# Patient Record
Sex: Female | Born: 1960 | Race: Black or African American | Hispanic: No | Marital: Single | Smoking: Never smoker
Health system: Southern US, Community
[De-identification: ages and names within clinical notes are randomized; demographics above are authoritative.]

## PROBLEM LIST (undated history)

## (undated) DIAGNOSIS — S069X9A Unspecified intracranial injury with loss of consciousness of unspecified duration, initial encounter: Secondary | ICD-10-CM

## (undated) DIAGNOSIS — R0789 Other chest pain: Secondary | ICD-10-CM

## (undated) DIAGNOSIS — S069XAA Unspecified intracranial injury with loss of consciousness status unknown, initial encounter: Secondary | ICD-10-CM

---

## 2014-10-16 ENCOUNTER — Encounter (HOSPITAL_COMMUNITY): Payer: Self-pay | Admitting: Emergency Medicine

## 2014-10-16 ENCOUNTER — Emergency Department (HOSPITAL_COMMUNITY): Payer: Self-pay

## 2014-10-16 ENCOUNTER — Observation Stay (HOSPITAL_COMMUNITY)
Admission: EM | Admit: 2014-10-16 | Discharge: 2014-10-18 | Disposition: A | Discharge status: Home / Self Care | Payer: Self-pay | Attending: Interventional Cardiology | Admitting: Interventional Cardiology

## 2014-10-16 ENCOUNTER — Encounter (HOSPITAL_COMMUNITY)
Admission: EM | Disposition: A | Discharge status: Home / Self Care | Payer: Self-pay | Source: Home / Self Care | Attending: Emergency Medicine

## 2014-10-16 DIAGNOSIS — R079 Chest pain, unspecified: Secondary | ICD-10-CM | POA: Diagnosis present

## 2014-10-16 DIAGNOSIS — I422 Other hypertrophic cardiomyopathy: Secondary | ICD-10-CM

## 2014-10-16 DIAGNOSIS — R0789 Other chest pain: Principal | ICD-10-CM

## 2014-10-16 DIAGNOSIS — R9431 Abnormal electrocardiogram [ECG] [EKG]: Secondary | ICD-10-CM | POA: Diagnosis present

## 2014-10-16 DIAGNOSIS — Z87820 Personal history of traumatic brain injury: Secondary | ICD-10-CM | POA: Insufficient documentation

## 2014-10-16 DIAGNOSIS — R0600 Dyspnea, unspecified: Secondary | ICD-10-CM | POA: Insufficient documentation

## 2014-10-16 DIAGNOSIS — R0602 Shortness of breath: Secondary | ICD-10-CM

## 2014-10-16 HISTORY — DX: Unspecified intracranial injury with loss of consciousness of unspecified duration, initial encounter: S06.9X9A

## 2014-10-16 HISTORY — DX: Other chest pain: R07.89

## 2014-10-16 HISTORY — DX: Unspecified intracranial injury with loss of consciousness status unknown, initial encounter: S06.9XAA

## 2014-10-16 LAB — BASIC METABOLIC PANEL
Anion gap: 15 (ref 5–15)
BUN: 13 mg/dL (ref 6–23)
CO2: 22 meq/L (ref 19–32)
CREATININE: 0.81 mg/dL (ref 0.50–1.10)
Calcium: 9.7 mg/dL (ref 8.4–10.5)
Chloride: 104 mEq/L (ref 96–112)
GFR calc non Af Amer: 80 mL/min — ABNORMAL LOW (ref >90)
Glucose, Bld: 93 mg/dL (ref 70–99)
POTASSIUM: 4.2 meq/L (ref 3.7–5.3)
Sodium: 141 mEq/L (ref 137–147)

## 2014-10-16 LAB — CBC
HEMATOCRIT: 44 % (ref 36.0–46.0)
HEMOGLOBIN: 14.7 g/dL (ref 12.0–15.0)
MCH: 30.4 pg (ref 26.0–34.0)
MCHC: 33.4 g/dL (ref 30.0–36.0)
MCV: 91.1 fL (ref 78.0–100.0)
Platelets: 185 10*3/uL (ref 150–400)
RBC: 4.83 MIL/uL (ref 3.87–5.11)
RDW: 14.8 % (ref 11.5–15.5)
WBC: 7 10*3/uL (ref 4.0–10.5)

## 2014-10-16 LAB — PROTIME-INR
INR: 1.03 (ref 0.00–1.49)
PROTHROMBIN TIME: 13.6 s (ref 11.6–15.2)

## 2014-10-16 LAB — PRO B NATRIURETIC PEPTIDE
Pro B Natriuretic peptide (BNP): 393.7 pg/mL — ABNORMAL HIGH (ref 0–125)
Pro B Natriuretic peptide (BNP): 321.4 pg/mL — ABNORMAL HIGH (ref 0–125)

## 2014-10-16 LAB — TROPONIN I: Troponin I: <0.3 ng/mL (ref <0.30)

## 2014-10-16 LAB — I-STAT TROPONIN, ED: Troponin i, poc: 0.04 ng/mL (ref 0.00–0.08)

## 2014-10-16 LAB — TSH: TSH: 1.78 u[IU]/mL (ref 0.350–4.500)

## 2014-10-16 LAB — D-DIMER, QUANTITATIVE: D-Dimer, Quant: 0.55 ug/mL-FEU — ABNORMAL HIGH (ref 0.00–0.48)

## 2014-10-16 SURGERY — LEFT HEART CATHETERIZATION WITH CORONARY ANGIOGRAM

## 2014-10-16 MED ORDER — ONDANSETRON HCL 4 MG/2ML IJ SOLN: 4.0000 mg | Freq: Four times a day (QID) | INTRAMUSCULAR | Status: DC | PRN

## 2014-10-16 MED ORDER — ACETAMINOPHEN 325 MG PO TABS: 650.0000 mg | ORAL_TABLET | ORAL | Status: DC | PRN

## 2014-10-16 MED ORDER — ENOXAPARIN SODIUM 40 MG/0.4ML ~~LOC~~ SOLN
40.0000 mg | Freq: Every day | SUBCUTANEOUS | Status: DC
  Administered 2014-10-16 – 2014-10-17 (×2): 40 mg via SUBCUTANEOUS
  Filled 2014-10-16 (×3): qty 0.4

## 2014-10-16 NOTE — ED Notes (Signed)
Dr. Katrinka BlazingSmith canceled code STEMI

## 2014-10-16 NOTE — Plan of Care (Signed)
Problem: Phase I Progression Outcomes Goal: Hemodynamically stable Outcome: Completed/Met Date Met:  10/16/14     

## 2014-10-16 NOTE — Plan of Care (Deleted)
Problem: Phase I Progression Outcomes Goal: Anginal pain relieved Outcome: Completed/Met Date Met:  10/16/14     

## 2014-10-16 NOTE — ED Notes (Signed)
Dr. Smith at bedside.

## 2014-10-16 NOTE — Plan of Care (Deleted)
Problem: Phase I Progression Outcomes Goal: Hemodynamically stable Outcome: Progressing     

## 2014-10-16 NOTE — Progress Notes (Addendum)
Chaplain responded to code stemi. Pt already in room. Chaplain met with pt friends who she is staying with for vacation. Pt lives in [place], [place] with her sister and caregiver who is currently on vacation in [place]. Pt has another sister in [place]. 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 Chaplain was introduced by pt friends in [place]. Chaplain provided ministry of presence, offered to keep pt in prayer, offered hospitality, and facilitated communication between pt friends and staff. Page chaplain as needed. Code stemi cancelled 5:55pm.   10/16/14 1800  Clinical Encounter Type  Visited With Patient and family together;Health care provider  Visit Type Initial;Code;Spiritual support  Referral From Nurse  Spiritual Encounters  Spiritual Needs Emotional  Stress Factors  Family Stress Factors Health changes;Lack of knowledge  Marcelino Scot 10/16/2014 6:27 PM

## 2014-10-16 NOTE — ED Notes (Signed)
Per ems pt was watching movie with caretaker on tablet when pt started complaining of chest pain, broke out in a sweat. Pt has huge family history of heart attacks, without diagnosis of heart disease. Pt 93% on RA, ems placed on 4 L sats to 100%. Received 1 nitro and 324 of aspirin in route

## 2014-10-16 NOTE — Plan of Care (Signed)
Problem: Phase I Progression Outcomes Goal: Aspirin unless contraindicated Outcome: Completed/Met Date Met:  10/16/14

## 2014-10-16 NOTE — ED Provider Notes (Signed)
CSN: [id number]     Arrival date & time 10/16/14  1721 History   First MD Initiated Contact with Patient 10/16/14 1746     Chief Complaint  Patient presents with  . Chest Pain     HPI  Patient presents as a code STEMI Initially the patient is here alone and information is obtained via translator phone. Eventually, the patient's sister arrived, and assisted with translation. The patient is [place], has a history of prior TBI. Patient's pain began less than 1 hour prior to my evaluation. Patient was at rest, when she developed headache, dyspnea, diaphoresis.  Subsequently she seems to develop pain in the thoracic area, though the location is unclear.  By the time of our evaluation patient has received nitroglycerin, aspirin via EMS, and pain resolved. Currently the patient also complains of mild headache.   Past Medical History  Diagnosis Date  . TBI (traumatic brain injury)    History reviewed. No pertinent past surgical history. History reviewed. No pertinent family history. History  Substance Use Topics  . Smoking status: Not on file  . Smokeless tobacco: Not on file  . Alcohol Use: Not on file   OB History    No data available     Review of Systems  Unable to perform ROS: Other      Allergies  Review of patient's allergies indicates not on file.  Home Medications   Prior to Admission medications   Not on File   BP 144/86 mmHg  Pulse 73  Resp 19  SpO2 100% Physical Exam  Constitutional: She is oriented to person, place, and time. She appears well-developed and well-nourished. No distress.  HENT:  Head: Normocephalic and atraumatic.  Eyes: Conjunctivae and EOM are normal.  Cardiovascular: Normal rate and regular rhythm.   Pulmonary/Chest: Effort normal and breath sounds normal. No stridor. No respiratory distress.  Abdominal: She exhibits no distension.  Musculoskeletal: She exhibits no edema.  Neurological: She is alert and oriented to person, place,  and time. No cranial nerve deficit.  Skin: Skin is warm and dry.  Psychiatric: She has a normal mood and affect.  Nursing note and vitals reviewed.   ED Course  Procedures (including critical care time) Labs Review Labs Reviewed  CBC  PROTIME-INR  PRO B NATRIURETIC PEPTIDE  BASIC METABOLIC PANEL  TROPONIN I  Rosezena SensorI-STAT TROPOININ, ED    Imaging Review Dg Chest Port 1 View  10/16/2014   CLINICAL DATA:  Chest pain, shortness of breath.  Code STEMI.  EXAM: PORTABLE CHEST - 1 VIEW  COMPARISON:  None.  FINDINGS: The heart size is at upper limits of normal. The aorta is unfolded and ectatic. Lungs are hypoaerated with crowding of the bronchovascular markings. Both lungs are clear. The visualized skeletal structures are unremarkable.  IMPRESSION: Lobe lung volumes without focal acute finding. Mild cardiomegaly without overt edema.   Electronically Signed   By: Christiana PellantGretchen  Green M.D.   On: 10/16/2014 18:18     I reviewed the EMS rhythm strips, all notable for T-wave inversions in multiple leads, ST elevation in individual leads, inconsistently.  EKG rate 74, ST-T wave changes, concerning for ischemia, abnormal  Patient's initial evaluation conducted with our radiology colleagues. Code STEMI was canceled given the absence of ongoing chest pain.  MDM  Patient presents with chest pain.  Patient's pain resolved prior to my evaluation.  Patient has abnormal EKG, but otherwise reassuring physical exam findings. Patient was admitted for further evaluation and management.   Molly Maduroobert  Jeraldine LootsLockwood, MD 10/17/14 Marlyne Beards0002

## 2014-10-16 NOTE — H&P (Signed)
Jasmine Ross is an 53 y.o. female.     Chief Complaint:  Chest pain and shortness of breath Primary cardiologist: none PCP: none HPI: Jasmine Ross is a 53 yo woman with PMH of TBI as a baby when she was dropped who intermittently visits [place] to visit her relatives/sisters but also lives with sisters in [place] who presents with sudden anxiety, shortness of breath and chest pain at 4:30 pm today. She was having her hair braided by her sister/relative and she developed sudden shortness of breath and some chest tightness/discomfort in left chest leading her sister to call 911. They were watching TV at the time. The ambulance performed and ECG and activated a STEMI and she was given aspirin. On arrival to ECG, Jasmine Ross was seen by ER team on my arrival with translator on the phone. She was still having intermittent pain but this was challenging to assess given language barrier and obtaining IV access. ECG reviewed and not clear STEMI but abnormal. Shortly thereafter her sister/relative arrived and improved history obtained. She said she was feeling better but with some intermittent chest discomfort and or dypsnea. She has a mother who died in front of her suddenly in her early 51s presumably due to unknown cardiac condition. She also had a brother who died suddenly at age 71. Her father lived into his 50s. She calmed down and symptoms resolved. Interventional cardiologist arrived, Dr. Tamala Julian, obtained history and we cancelled code STEMI given resolved symptoms without ECG after story confirmed. Per her relative, she is able to walk as far as she likes but does have some gait issues. No recent syncope, or exercise intolerance.   Past Medical History  Diagnosis Date  . TBI (traumatic brain injury)     History reviewed. No pertinent past surgical history. Family history as above  History reviewed. No pertinent family history. Social History:  reports that she has never smoked. She  has never used smokeless tobacco. She reports that she does not drink alcohol or use illicit drugs.  Allergies: Not on File   (Not in a hospital admission)  Results for orders placed or performed during the hospital encounter of 10/16/14 (from the past 48 hour(s))  Basic metabolic panel     Status: Abnormal   Collection Time: 10/16/14  5:40 PM  Result Value Ref Range   Sodium 141 137 - 147 mEq/L   Potassium 4.2 3.7 - 5.3 mEq/L   Chloride 104 96 - 112 mEq/L   CO2 22 19 - 32 mEq/L   Glucose, Bld 93 70 - 99 mg/dL   BUN 13 6 - 23 mg/dL   Creatinine, Ser 0.81 0.50 - 1.10 mg/dL   Calcium 9.7 8.4 - 10.5 mg/dL   GFR calc non Af Amer 80 (L) >90 mL/min   GFR calc Af Amer >90 >90 mL/min    Comment: (NOTE) The eGFR has been calculated using the CKD EPI equation. This calculation has not been validated in all clinical situations. eGFR's persistently <90 mL/min signify possible Chronic Kidney Disease.    Anion gap 15 5 - 15  CBC     Status: None   Collection Time: 10/16/14  5:40 PM  Result Value Ref Range   WBC 7.0 4.0 - 10.5 K/uL   RBC 4.83 3.87 - 5.11 MIL/uL   Hemoglobin 14.7 12.0 - 15.0 g/dL   HCT 44.0 36.0 - 46.0 %   MCV 91.1 78.0 - 100.0 fL   MCH 30.4 26.0 - 34.0 pg  MCHC 33.4 30.0 - 36.0 g/dL   RDW 14.8 11.5 - 15.5 %   Platelets 185 150 - 400 K/uL  Troponin I     Status: None   Collection Time: 10/16/14  5:40 PM  Result Value Ref Range   Troponin I <0.30 <0.30 ng/mL    Comment:        Due to the release kinetics of cTnI, a negative result within the first hours of the onset of symptoms does not rule out myocardial infarction with certainty. If myocardial infarction is still suspected, repeat the test at appropriate intervals.   Protime-INR     Status: None   Collection Time: 10/16/14  5:40 PM  Result Value Ref Range   Prothrombin Time 13.6 11.6 - 15.2 seconds   INR 1.03 0.00 - 1.49  I-stat troponin, ED (not at [facility])     Status: None   Collection Time: 10/16/14  5:45  PM  Result Value Ref Range   Troponin i, poc 0.04 0.00 - 0.08 ng/mL   Comment 3            Comment: Due to the release kinetics of cTnI, a negative result within the first hours of the onset of symptoms does not rule out myocardial infarction with certainty. If myocardial infarction is still suspected, repeat the test at appropriate intervals.    Dg Chest Port 1 View  10/16/2014   CLINICAL DATA:  Chest pain, shortness of breath.  Code STEMI.  EXAM: PORTABLE CHEST - 1 VIEW  COMPARISON:  None.  FINDINGS: The heart size is at upper limits of normal. The aorta is unfolded and ectatic. Lungs are hypoaerated with crowding of the bronchovascular markings. Both lungs are clear. The visualized skeletal structures are unremarkable.  IMPRESSION: Lobe lung volumes without focal acute finding. Mild cardiomegaly without overt edema.   Electronically Signed   By: Conchita Paris M.D.   On: 10/16/2014 18:18    Review of Systems  Constitutional: Negative for fever and chills.  HENT: Negative for hearing loss and nosebleeds.   Eyes: Negative for double vision and photophobia.  Respiratory: Positive for shortness of breath.   Cardiovascular: Positive for chest pain and palpitations. Negative for orthopnea, claudication and leg swelling.  Gastrointestinal: Negative for nausea, vomiting and abdominal pain.  Genitourinary: Negative for dysuria and hematuria.  Musculoskeletal: Negative for myalgias and neck pain.  Skin: Negative for rash.  Neurological: Positive for headaches. Negative for dizziness, tingling and tremors.  Endo/Heme/Allergies: Negative for polydipsia. Does not bruise/bleed easily.  Psychiatric/Behavioral: Negative for depression, suicidal ideas and substance abuse.    Blood pressure 133/85, pulse 68, resp. rate 20, height 5' 3"  (1.6 m), weight 72.576 kg (160 lb), SpO2 100 %. Physical Exam  Nursing note and vitals reviewed. Constitutional: She appears well-developed and well-nourished. No  distress.  HENT:  Head: Normocephalic and atraumatic.  Nose: Nose normal.  Mouth/Throat: Oropharynx is clear and moist. No oropharyngeal exudate.  Eyes: No scleral icterus.  Left eye with presumed large cataract  Neck: Normal range of motion. Neck supple. No JVD present. No tracheal deviation present.  Cardiovascular: Normal rate, regular rhythm, normal heart sounds and intact distal pulses.  Exam reveals no gallop.   No murmur heard. Respiratory: Effort normal and breath sounds normal. No respiratory distress. She has no wheezes. She has no rales.  GI: Soft. Bowel sounds are normal. She exhibits no distension. There is no tenderness. There is no rebound.  Musculoskeletal: Normal range of motion. She exhibits  no edema or tenderness.  Neurological: She is alert. Coordination normal.  She speaks [place] but understands some english; she knew her name and that she was being evaluated in medical facility  Skin: Skin is warm and dry. No rash noted. She is not diaphoretic. No erythema.  Psychiatric:  Challenging to assess given language barrier but she appeared to calm down when her relative was there  labs reviewed above; Cr 0.81, trop <0.3, INR 1 ECG reviewed; SR 74, LVH, repolarization 2/2 LVH with more prominent ST depressions   Assessment/Plan Jasmine Ross is a 53 yo woman with PMH of TBI, family history of cardiac conditions earlier in life who presents with shortness of breath and chest pain. She is currently chest pain and dyspnea free. Differential diagnosis is ACS, musculoskeletal chest pain, pulmonary embolism, cardiomyopathy, anxiety and other etiologies. I favor a diagnosis of cardiomyopathy, potentially hypertrophic cardiomyopathy with component of anxiety. PE also possible. Will observe overnight on telemetry, obtain echocardiogram, trend cardiac markers.  1. Chest pain: differential as above. Trend cardiac markers, telemetry. Received aspirin.  2. Dyspnea: obtain d-dimer, if  elevated, then CTA, obtain echocardiogram. Suspect possible cardiomyopathy with LVH.  3. Abnormal ECG: as above.    Jasmine Ross 10/16/2014, 8:33 PM

## 2014-10-16 NOTE — Plan of Care (Signed)
Problem: Phase I Progression Outcomes Goal: Anginal pain relieved Outcome: Completed/Met Date Met:  10/16/14

## 2014-10-16 NOTE — ED Notes (Signed)
Caregiver at bedside now, able to provide more information. Pt was having headache and SOB, along with chest pain. Pt has history of TBI when a young child. Able to understand information told to her but has problems talking to others. Pt does not appear to be in any distress at this moment. Pt states her pain is "a little bit" on left side of chest and epigastric area.

## 2014-10-17 ENCOUNTER — Encounter (HOSPITAL_COMMUNITY): Payer: Self-pay | Admitting: *Deleted

## 2014-10-17 ENCOUNTER — Observation Stay (HOSPITAL_COMMUNITY): Payer: Self-pay

## 2014-10-17 DIAGNOSIS — R0789 Other chest pain: Secondary | ICD-10-CM

## 2014-10-17 DIAGNOSIS — I517 Cardiomegaly: Secondary | ICD-10-CM

## 2014-10-17 LAB — CBC WITH DIFFERENTIAL/PLATELET
Basophils Absolute: 0 10*3/uL (ref 0.0–0.1)
Basophils Relative: 0 % (ref 0–1)
EOS ABS: 0.1 10*3/uL (ref 0.0–0.7)
Eosinophils Relative: 1 % (ref 0–5)
HCT: 42.2 % (ref 36.0–46.0)
HEMOGLOBIN: 14.2 g/dL (ref 12.0–15.0)
LYMPHS ABS: 2 10*3/uL (ref 0.7–4.0)
LYMPHS PCT: 33 % (ref 12–46)
MCH: 29.6 pg (ref 26.0–34.0)
MCHC: 33.6 g/dL (ref 30.0–36.0)
MCV: 87.9 fL (ref 78.0–100.0)
MONOS PCT: 5 % (ref 3–12)
Monocytes Absolute: 0.3 10*3/uL (ref 0.1–1.0)
NEUTROS ABS: 3.7 10*3/uL (ref 1.7–7.7)
NEUTROS PCT: 61 % (ref 43–77)
Platelets: 174 10*3/uL (ref 150–400)
RBC: 4.8 MIL/uL (ref 3.87–5.11)
RDW: 14.8 % (ref 11.5–15.5)
WBC: 6 10*3/uL (ref 4.0–10.5)

## 2014-10-17 LAB — TROPONIN I
Troponin I: <0.3 ng/mL (ref <0.30)
Troponin I: <0.3 ng/mL (ref <0.30)

## 2014-10-17 LAB — COMPREHENSIVE METABOLIC PANEL
ALK PHOS: 71 U/L (ref 39–117)
ALT: 14 U/L (ref 0–35)
ANION GAP: 14 (ref 5–15)
AST: 16 U/L (ref 0–37)
Albumin: 3.9 g/dL (ref 3.5–5.2)
BUN: 12 mg/dL (ref 6–23)
CHLORIDE: 106 meq/L (ref 96–112)
CO2: 22 meq/L (ref 19–32)
Calcium: 9.5 mg/dL (ref 8.4–10.5)
Creatinine, Ser: 0.8 mg/dL (ref 0.50–1.10)
GFR, EST NON AFRICAN AMERICAN: 81 mL/min — AB (ref >90)
GLUCOSE: 110 mg/dL — AB (ref 70–99)
POTASSIUM: 4.1 meq/L (ref 3.7–5.3)
Sodium: 142 mEq/L (ref 137–147)
Total Bilirubin: 1 mg/dL (ref 0.3–1.2)
Total Protein: 7.3 g/dL (ref 6.0–8.3)

## 2014-10-17 LAB — LIPID PANEL
CHOLESTEROL: 159 mg/dL (ref 0–200)
HDL: 34 mg/dL — ABNORMAL LOW (ref >39)
LDL Cholesterol: 93 mg/dL (ref 0–99)
Total CHOL/HDL Ratio: 4.7 RATIO
Triglycerides: 160 mg/dL — ABNORMAL HIGH (ref <150)
VLDL: 32 mg/dL (ref 0–40)

## 2014-10-17 MED ORDER — IOHEXOL 350 MG/ML SOLN
100.0000 mL | Freq: Once | INTRAVENOUS | Status: AC | PRN
  Administered 2014-10-17: 100 mL via INTRAVENOUS

## 2014-10-17 NOTE — Plan of Care (Signed)
Problem: Phase II Progression Outcomes Goal: Anginal pain relieved Outcome: Completed/Met Date Met:  10/17/14 Goal: CV Risk Factors identified Outcome: Completed/Met Date Met:  10/17/14

## 2014-10-17 NOTE — Progress Notes (Signed)
Utilization Review Completed.   Berdine Rasmusson, RN, BSN Nurse Case Manager  

## 2014-10-17 NOTE — Progress Notes (Signed)
Patient ID: Jasmine Ross, female   DOB: [birth year], 53 y.o.   MRN: [id number]    Subjective:  Denies SSCP, palpitations or Dyspnea Discussed with sister who speaks English and is a dentist   Objective:  Filed Vitals:   10/16/14 2115 10/16/14 2202 10/17/14 0053 10/17/14 0453  BP: 121/86 146/93 129/82 130/82  Pulse: 75 79 65 67  Temp:  98.1 F (36.7 C) 97.8 F (36.6 C) 97.5 F (36.4 C)  TempSrc:  Oral Oral Oral  Resp: 19 18 18 18   Height:  5\' 3"  (1.6 m)    Weight:  74.8 kg (164 lb 14.5 oz)    SpO2: 98% 100% 98% 100%    Intake/Output from previous day:  Intake/Output Summary (Last 24 hours) at 10/17/14 1116 Last data filed at 10/17/14 0455  Gross per 24 hour  Intake      0 ml  Output    500 ml  Net   -500 ml    Physical Exam: Affect appropriate Middle aged [place] female HEENT: Opaque left eye  Neck supple with no adenopathy JVP normal no bruits no thyromegaly Lungs clear with no wheezing and good diaphragmatic motion Heart:  S1/S2 no murmur, no rub, gallop or click PMI normal Abdomen: benighn, BS positve, no tenderness, no AAA no bruit.  No HSM or HJR Distal pulses intact with no bruits No edema Neuro non-focal Skin warm and dry No muscular weakness   Lab Results: Basic Metabolic Panel:  Recent Labs  40/98/1110/06/23 1740 10/17/14 0314  NA 141 142  K 4.2 4.1  CL 104 106  CO2 22 22  GLUCOSE 93 110*  BUN 13 12  CREATININE 0.81 0.80  CALCIUM 9.7 9.5   Liver Function Tests:  Recent Labs  10/17/14 0314  AST 16  ALT 14  ALKPHOS 71  BILITOT 1.0  PROT 7.3  ALBUMIN 3.9   CBC:  Recent Labs  10/16/14 1740 10/17/14 0314  WBC 7.0 6.0  NEUTROABS  --  3.7  HGB 14.7 14.2  HCT 44.0 42.2  MCV 91.1 87.9  PLT 185 174   Cardiac Enzymes:  Recent Labs  10/16/14 2121 10/17/14 0314 10/17/14 0945  TROPONINI <0.30 <0.30 <0.30   BNP: Invalid input(s): POCBNP D-Dimer:  Recent Labs  10/16/14 2121  DDIMER 0.55*   Fasting Lipid Panel:  Recent  Labs  10/17/14 0425  CHOL 159  HDL 34*  LDLCALC 93  TRIG [id number]*  CHOLHDL 4.7   Thyroid Function Tests:  Recent Labs  10/16/14 2121  TSH 1.780    Imaging: Ct Angio Chest Pe W/cm &/or Wo Cm  10/17/2014   CLINICAL DATA:  Midsternal chest pain since yesterday. Acute shortness of breath. Increased D-dimer.  EXAM: CT ANGIOGRAPHY CHEST WITH CONTRAST  TECHNIQUE: Multidetector CT imaging of the chest was performed using the standard protocol during bolus administration of intravenous contrast. Multiplanar CT image reconstructions and MIPs were obtained to evaluate the vascular anatomy.  CONTRAST:  100mL OMNIPAQUE IOHEXOL 350 MG/ML SOLN  COMPARISON:  None.  FINDINGS: Technically adequate study with good opacification of the central and segmental pulmonary arteries. No focal filling defects are demonstrated. No evidence of significant pulmonary embolus.  Normal heart size. Normal caliber thoracic aorta. No significant lymphadenopathy in the chest. Esophagus is decompressed. Evaluation lungs is limited due to respiratory motion artifact. Mild dependent changes are present in the lung bases. No focal consolidation. Airways appear patent. No pleural effusion. No pneumothorax. Visualized portions of the upper abdominal organs are grossly unremarkable.  Review of the MIP images confirms the above findings.  IMPRESSION: No evidence of significant pulmonary embolus.   Electronically Signed   By: Burman NievesWilliam  Stevens M.D.   On: 10/17/2014 00:44   Dg Chest Port 1 View  10/16/2014   CLINICAL DATA:  Chest pain, shortness of breath.  Code STEMI.  EXAM: PORTABLE CHEST - 1 VIEW  COMPARISON:  None.  FINDINGS: The heart size is at upper limits of normal. The aorta is unfolded and ectatic. Lungs are hypoaerated with crowding of the bronchovascular markings. Both lungs are clear. The visualized skeletal structures are unremarkable.  IMPRESSION: Lobe lung volumes without focal acute finding. Mild cardiomegaly without overt edema.    Electronically Signed   By: Christiana PellantGretchen  Green M.D.   On: 10/16/2014 18:18    Cardiac Studies:  ECG:  SR LVH with strain marked T wave inversions    Telemetry:  NSR no VT  Echo: EF normal basal septal hypertrophy no gradient   Medications:   . 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   Dyspnea:  Improved ? Anxiety  CT no PE and CXR normal and echo with normal RV/LV function   Charlton HawsPeter Lakya Schrupp 10/17/2014, 11:16 AM

## 2014-10-17 NOTE — Progress Notes (Signed)
  Echocardiogram 2D Echocardiogram has been performed.  Janalyn HarderWest, Meredith Kilbride R 10/17/2014, 9:16 AM

## 2014-10-18 ENCOUNTER — Observation Stay (HOSPITAL_COMMUNITY): Payer: MEDICAID

## 2014-10-18 ENCOUNTER — Encounter (HOSPITAL_COMMUNITY): Payer: Self-pay | Admitting: Nurse Practitioner

## 2014-10-18 DIAGNOSIS — R072 Precordial pain: Secondary | ICD-10-CM

## 2014-10-18 DIAGNOSIS — I422 Other hypertrophic cardiomyopathy: Secondary | ICD-10-CM

## 2014-10-18 DIAGNOSIS — R0789 Other chest pain: Secondary | ICD-10-CM

## 2014-10-18 LAB — HEMOGLOBIN A1C
HEMOGLOBIN A1C: 6.3 % — AB (ref <5.7)
MEAN PLASMA GLUCOSE: 134 mg/dL — AB (ref <117)

## 2014-10-18 MED ORDER — GADOBENATE DIMEGLUMINE 529 MG/ML IV SOLN
25.0000 mL | Freq: Once | INTRAVENOUS | Status: AC
  Administered 2014-10-18: 25 mL via INTRAVENOUS

## 2014-10-18 NOTE — Discharge Summary (Signed)
Discharge Summary   Patient ID: Jasmine Ross,  MRN: [id number], DOB/AGE: [birth year] 55 y.o.  Admit date: 10/16/2014 Discharge date: 10/18/2014  Primary Care Provider: No PCP Per Patient Primary Cardiologist: Mendel RyderH. Smith, MD   Discharge Diagnoses Principal Problem:   Midsternal chest pain  **Neg CE, nl LV fxn by echo this admission.  Active Problems:   Abnormal ECG  **LVH with anterolateral ST dep and TWI.  Allergies No Known Allergies  Procedures  2D Echocardiogram 11.8.2015  Study Conclusions  - Left ventricle: The cavity size was normal. There was moderate   focal basal hypertrophy of the septum. Systolic function was   normal. The estimated ejection fraction was in the range of 55%   to 60%. Wall motion was normal; there were no regional wall   motion abnormalities. Doppler parameters are consistent with   abnormal left ventricular relaxation (grade 1 diastolic   dysfunction). - Aortic valve: Trileaflet; normal thickness, mildly calcified   leaflets. There was no regurgitation. - Mitral valve: Structurally normal valve. There was no   regurgitation. - Right ventricle: Systolic function was normal. - Right atrium: The atrium was normal in size. - Tricuspid valve: There was no regurgitation. - Pulmonary arteries: Systolic pressure was within the normal   range. - Inferior vena cava: The vessel was normal in size. - Pericardium, extracardiac: There was no pericardial effusion.  Impressions:  - Abnormal relaxation with normal filling pressures, otherwise   normal study. _____________   CT Angio of the Chest with Contrast 11.8.2015  IMPRESSION: No evidence of significant pulmonary embolus. _____________   Cardiac MRI 11.9.2015  Preliminary report:  Normal LV function.  Mild septal thickening with minor scar.  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  She was in her usual state of health until the afternoon of 11/7, when she had sudden onset of dyspnea and chest tightness.  EMS was called and she was taken [facility].  There, ECG showed anterolateral ST depression with TWI.  She continued to have intermittent chest pain.  Initially, a Code STEMI was called but this was subsequently cancelled after review with interventional cardiology.  She was admitted for further evaluation.  Hospital Course  Patient ruled out for MI.  2D echo was performed on 11/8 and showed normal LV/RV function with diastolic dysfunction.  CTA of the chest was negative for PE.  Upon further questioning of family members, it was discovered that there is a family history of sudden death in that patient's mother and brother.  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  Cardiac MRI was performed this AM and revealed mild septal thickening with minor scar.  She will be discharged home today in good condition.  We have arranged for electrophysiology follow-up, for consideration of genetic testing given family history of sudden cardiac death.  Discharge Vitals Blood pressure 113/80, pulse 68, temperature 98.4 F (36.9 C), temperature source Oral, resp. rate 20, height 5\' 3"  (1.6 m), weight 164 lb 14.5 oz (74.8 kg), SpO2 98 %.  Filed Weights   10/16/14 1958 10/16/14 2202  Weight: 160 lb (72.576 kg) 164 lb 14.5 oz (74.8 kg)   Labs  CBC  Recent Labs  10/16/14 1740 10/17/14 0314  WBC 7.0 6.0  NEUTROABS  --  3.7  HGB 14.7  14.2  HCT 44.0 42.2  MCV 91.1 87.9  PLT 185 174   Basic Metabolic Panel  Recent Labs  10/16/14 1740 10/17/14 0314  NA 141 142  K 4.2 4.1  CL 104 106  CO2 22 22  GLUCOSE 93 110*  BUN 13 12  CREATININE 0.81 0.80  CALCIUM 9.7 9.5   Liver Function Tests  Recent Labs  10/17/14 0314  AST 16  ALT 14    ALKPHOS 71  BILITOT 1.0  PROT 7.3  ALBUMIN 3.9   Cardiac Enzymes  Recent Labs  10/16/14 2121 10/17/14 0314 10/17/14 0945  TROPONINI <0.30 <0.30 <0.30   BNP Invalid input(s): POCBNP D-Dimer  Recent Labs  10/16/14 2121  DDIMER 0.55*   Hemoglobin A1C  Recent Labs  10/16/14 2121  HGBA1C 6.3*   Fasting Lipid Panel  Recent Labs  10/17/14 0425  CHOL 159  HDL 34*  LDLCALC 93  TRIG 409160*  CHOLHDL 4.7   Thyroid Function Tests  Recent Labs  10/16/14 2121  TSH 1.780    Disposition  Pt is being discharged home today in good condition.  Follow-up Plans & Appointments      Follow-up Information    Follow up with Sherryl MangesSteven Klein, MD On 11/11/2014.   Specialty:  Cardiology   Why:  10:30   Contact information:   [address] [place] [address] [phone]      Discharge Medications    Medication List    TAKE these medications        multivitamin with minerals Tabs tablet  Take 1 tablet by mouth daily.       Outstanding Labs/Studies  None  Duration of Discharge Encounter   Greater than 30 minutes including physician time.  Signed, Nicolasa Duckinghristopher Gladis Soley NP 10/18/2014, 1:27 PM

## 2014-10-18 NOTE — Progress Notes (Signed)
Discharge instructions given. Pt verbalized understanding and all questions were answered.  

## 2014-10-18 NOTE — Discharge Instructions (Signed)
***  PLEASE REMEMBER TO BRING ALL OF YOUR MEDICATIONS TO EACH OF YOUR FOLLOW-UP OFFICE VISITS.  

## 2014-10-18 NOTE — Progress Notes (Signed)
Patient ID: Jasmine Ross, female   DOB: 02-Mar-1961, 53 y.o.   MRN: [id number]    Subjective:  Denies SSCP, palpitations or Dyspnea Difficult to understand due to previous head trauma injury   Objective:  Filed Vitals:   10/17/14 0453 10/17/14 1532 10/17/14 2100 10/18/14 0539  BP: 130/82 118/79 130/95 113/80  Pulse: 67 75 72 68  Temp: 97.5 F (36.4 C) 98.2 F (36.8 C) 98.2 F (36.8 C) 98.4 F (36.9 C)  TempSrc: Oral Oral Oral Oral  Resp: 18 17 20 20   Height:      Weight:      SpO2: 100% 99% 98% 98%    Intake/Output from previous day:  Intake/Output Summary (Last 24 hours) at 10/18/14 0916 Last data filed at 10/17/14 1846  Gross per 24 hour  Intake    240 ml  Output      0 ml  Net    240 ml    Physical Exam: Affect appropriate Middle aged [place] female HEENT: Opaque left eye  Neck supple with no adenopathy JVP normal no bruits no thyromegaly Lungs clear with no wheezing and good diaphragmatic motion Heart:  S1/S2 no murmur, no rub, gallop or click PMI normal Abdomen: benighn, BS positve, no tenderness, no AAA no bruit.  No HSM or HJR Distal pulses intact with no bruits No edema Neuro non-focal Skin warm and dry No muscular weakness   Lab Results: Basic Metabolic Panel:  Recent Labs  40/98/1110/06/23 1740 10/17/14 0314  NA 141 142  K 4.2 4.1  CL 104 106  CO2 22 22  GLUCOSE 93 110*  BUN 13 12  CREATININE 0.81 0.80  CALCIUM 9.7 9.5   Liver Function Tests:  Recent Labs  10/17/14 0314  AST 16  ALT 14  ALKPHOS 71  BILITOT 1.0  PROT 7.3  ALBUMIN 3.9   CBC:  Recent Labs  10/16/14 1740 10/17/14 0314  WBC 7.0 6.0  NEUTROABS  --  3.7  HGB 14.7 14.2  HCT 44.0 42.2  MCV 91.1 87.9  PLT 185 174   Cardiac Enzymes:  Recent Labs  10/16/14 2121 10/17/14 0314 10/17/14 0945  TROPONINI <0.30 <0.30 <0.30   BNP: Invalid input(s): POCBNP D-Dimer:  Recent Labs  10/16/14 2121  DDIMER 0.55*   Fasting Lipid Panel:  Recent Labs   10/17/14 0425  CHOL 159  HDL 34*  LDLCALC 93  TRIG [id number]*  CHOLHDL 4.7   Thyroid Function Tests:  Recent Labs  10/16/14 2121  TSH 1.780    Imaging: Ct Angio Chest Pe W/cm &/or Wo Cm  10/17/2014   CLINICAL DATA:  Midsternal chest pain since yesterday. Acute shortness of breath. Increased D-dimer.  EXAM: CT ANGIOGRAPHY CHEST WITH CONTRAST  TECHNIQUE: Multidetector CT imaging of the chest was performed using the standard protocol during bolus administration of intravenous contrast. Multiplanar CT image reconstructions and MIPs were obtained to evaluate the vascular anatomy.  CONTRAST:  100mL OMNIPAQUE IOHEXOL 350 MG/ML SOLN  COMPARISON:  None.  FINDINGS: Technically adequate study with good opacification of the central and segmental pulmonary arteries. No focal filling defects are demonstrated. No evidence of significant pulmonary embolus.  Normal heart size. Normal caliber thoracic aorta. No significant lymphadenopathy in the chest. Esophagus is decompressed. Evaluation lungs is limited due to respiratory motion artifact. Mild dependent changes are present in the lung bases. No focal consolidation. Airways appear patent. No pleural effusion. No pneumothorax. Visualized portions of the upper abdominal organs are grossly unremarkable.  Review of the MIP  images confirms the above findings.  IMPRESSION: No evidence of significant pulmonary embolus.   Electronically Signed   By: Burman NievesWilliam  Stevens M.D.   On: 10/17/2014 00:44   Dg Chest Port 1 View  10/16/2014   CLINICAL DATA:  Chest pain, shortness of breath.  Code STEMI.  EXAM: PORTABLE CHEST - 1 VIEW  COMPARISON:  None.  FINDINGS: The heart size is at upper limits of normal. The aorta is unfolded and ectatic. Lungs are hypoaerated with crowding of the bronchovascular markings. Both lungs are clear. The visualized skeletal structures are unremarkable.  IMPRESSION: Lobe lung volumes without focal acute finding. Mild cardiomegaly without overt edema.    Electronically Signed   By: Christiana PellantGretchen  Green M.D.   On: 10/16/2014 18:18    Cardiac Studies:  ECG:  SR LVH with strain marked T wave inversions    Telemetry:  NSR no VT  Echo: EF normal basal septal hypertrophy no gradient   Medications:   . 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   Dyspnea:  Improved ? Anxiety  CT no PE and CXR normal and echo with normal RV/LV function   Possible d/c latter today after MRI    Charlton Hawseter Welborn Keena 10/18/2014, 9:16 AM

## 2014-11-09 ENCOUNTER — Encounter: Payer: Self-pay | Admitting: *Deleted

## 2014-11-11 ENCOUNTER — Encounter: Payer: Self-pay | Admitting: *Deleted

## 2014-11-11 ENCOUNTER — Ambulatory Visit (INDEPENDENT_AMBULATORY_CARE_PROVIDER_SITE_OTHER): Payer: Self-pay | Admitting: Internal Medicine

## 2014-11-11 VITALS — BP 122/70 | HR 69 | Ht 63.0 in | Wt 164.0 lb

## 2014-11-11 DIAGNOSIS — R931 Abnormal findings on diagnostic imaging of heart and coronary circulation: Secondary | ICD-10-CM

## 2014-11-11 DIAGNOSIS — R9431 Abnormal electrocardiogram [ECG] [EKG]: Secondary | ICD-10-CM

## 2014-11-11 NOTE — Progress Notes (Signed)
ELECTROPHYSIOLOGY CONSULT NOTE  Patient ID: Jasmine Ross, MRN: [id number], DOB/AGE: 11-Apr-1961 53 y.o. Admit date: (Not on file) Date of Consult: 11/11/2014  Primary Physician: No PCP Per Patient Primary Cardiologist: new Chief Complaint: ? HCM   HPI Jasmine Ross is a 53 y.o. female  Who was admitted with chest pain and diaphoresis   EMS called and pt evealuated at hospital with neg CT and MR   No stress test was done   MRI  1) Small LV cavity with apical obliteration in systole. No LVOT gradient Mild asymmetric septal hypertrophy 15 mm EF 71%  2) Mild delayed enhancement in distal anterior wall mid myocardium and basal anterior septum  Echo had demonstrated moderate focal basal hypertrophy  Family history has included the sudden death of her brother at the age of 55 and her mother at the age of 55.    Past Medical History  Diagnosis Date  . TBI (traumatic brain injury)   . Midsternal chest pain     a. 10/2014 admission and r/o;  b. 10/2014 Echo: EF 55-60%, Gr 1 DD, nl RV.      Surgical History: No past surgical history on file.   Home Meds: Prior to Admission medications   Medication Sig Start Date End Date Taking? Authorizing Provider  Multiple Vitamin (MULTIVITAMIN WITH MINERALS) TABS tablet Take 1 tablet by mouth daily.   Yes Historical Provider, MD    Inpatient Medications:    Allergies: No Known Allergies  History   Social History  . Marital Status: Single    Spouse Name: N/A    Number of Children: N/A  . Years of Education: N/A   Occupational History  . Not on file.   Social History Main Topics  . Smoking status: Never Smoker   . Smokeless tobacco: Never Used  . Alcohol Use: No  . Drug Use: No  . Sexual Activity: Not on file   Other Topics Concern  . Not on file   Social History Narrative     Family History  Problem Relation Age of Onset  . Sudden death Brother   . Unexplained death Mother     CARDIAC     ROS:  Please  see the history of present illness.     All other systems reviewed and negative.    Physical Exam:   Blood pressure 122/70, pulse 69, height 5\' 3"  (1.6 m), weight 164 lb (74.39 kg).  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 CBC Lab Results  Component Value Date   WBC 6.0 10/17/2014   HGB 14.2 10/17/2014   HCT 42.2 10/17/2014   MCV 87.9 10/17/2014   PLT 174 10/17/2014   PROTIME: No results for input(s): LABPROT, INR in the last 72 hours. Chemistry No results for input(s): NA, K, CL, CO2, BUN, CREATININE, CALCIUM, PROT, BILITOT, ALKPHOS, ALT, AST, GLUCOSE in the last 168 hours.  Invalid input(s): LABALBU Lipids Lab Results  Component Value Date   CHOL 159 10/17/2014   HDL 34* 10/17/2014   LDLCALC 93 10/17/2014   TRIG 160* 10/17/2014   BNP PRO B NATRIURETIC PEPTIDE (BNP)  Date/Time Value Ref Range Status  10/16/2014 09:19 PM 321.4* 0 - 125 pg/mL Final  10/16/2014 05:40 PM 393.7* 0 - 125 pg/mL Final   Miscellaneous Lab Results  Component Value Date   DDIMER 0.55* 10/16/2014    Radiology/Studies:  Ct Angio Chest Pe W/cm &/or Wo Cm  10/17/2014   CLINICAL DATA:  Midsternal chest pain since yesterday. Acute shortness of breath. Increased D-dimer.  EXAM: CT ANGIOGRAPHY CHEST WITH CONTRAST  TECHNIQUE: Multidetector CT imaging of the chest was performed using the standard protocol during bolus administration of intravenous contrast. Multiplanar CT image reconstructions and MIPs were obtained to evaluate the vascular anatomy.  CONTRAST:  100mL OMNIPAQUE IOHEXOL 350 MG/ML SOLN  COMPARISON:  None.  FINDINGS: Technically adequate study with good opacification of the central and segmental pulmonary arteries. No focal filling defects are demonstrated. No evidence of significant pulmonary embolus.  Normal heart size. Normal caliber thoracic aorta. No significant lymphadenopathy in  the chest. Esophagus is decompressed. Evaluation lungs is limited due to respiratory motion artifact. Mild dependent changes are present in the lung bases. No focal consolidation. Airways appear patent. No pleural effusion. No pneumothorax. Visualized portions of the upper abdominal organs are grossly unremarkable.  Review of the MIP images confirms the above findings.  IMPRESSION: No evidence of significant pulmonary embolus.   Electronically Signed   By: Burman NievesWilliam  Stevens M.D.   On: 10/17/2014 00:44   Dg Chest Port 1 View  10/16/2014   CLINICAL DATA:  Chest pain, shortness of breath.  Code STEMI.  EXAM: PORTABLE CHEST - 1 VIEW  COMPARISON:  None.  FINDINGS: The heart size is at upper limits of normal. The aorta is unfolded and ectatic. Lungs are hypoaerated with crowding of the bronchovascular markings. Both lungs are clear. The visualized skeletal structures are unremarkable.  IMPRESSION: Lobe lung volumes without focal acute finding. Mild cardiomegaly without overt edema.   Electronically Signed   By: Christiana PellantGretchen  Green M.D.   On: 10/16/2014 18:18   Mr Card Morphology Wo/w Cm  10/19/2014   CLINICAL DATA:  Family history of sudden death Hypertrophic Cardiomyopathy  EXAM: CARDIAC MRI  TECHNIQUE: The patient was scanned on a 1.5 Tesla GE magnet. A dedicated cardiac coil was used. Functional imaging was done using Fiesta sequences. 2,3, and 4 chamber views were done to assess for RWMA's. Modified Simpson's rule using a short axis stack was used to calculate an ejection fraction on a dedicated work Research officer, trade unionstation using Circle software. The patient received 24 cc of Multihance. After 10 minutes inversion recovery sequences were used to assess for infiltration and scar tissue.  CONTRAST:  24 cc Multihance  FINDINGS: There was mild LAE. The RA/RV were normal in size and function. Aortic, mitral and tricuspid valves structurally normal. The LV cavity was small with apical cavity obliteration in systole. There was a spade  like cavity with small volume. There were no RWMAls There was mild asymmetric septal hypertrophy 15mm with no LVOT gradient. The quantitative EF was 71%. Delayed enhancement images had mild uptake in the basal anterior septum and distal anterior wall. There was no ASD, VSD or pericardial effusion. The ascending aorta was normal at 3.0 cm  IMPRESSION: 1) Small LV cavity with apical obliteration in systole. No LVOT gradient Mild asymmetric septal hypertrophy 15 mm EF 71%  2) Mild delayed enhancement in distal anterior wall mid myocardium and basal anterior septum  3) Mild LAE  Charlton HawsPeter Nishan   Electronically Signed   By: Charlton HawsPeter  Nishan M.D.   On: 10/19/2014 12:59    EKG  Sinus rhythm with STT changes  consistent with repolarization  Assessment and Plan: ] Possible HCM  Abnormal ECG  FHx Sudden death  i spent an hour reviewing the chart and with family  the issues related  Diagnostic question as to whether the patient has  HOCM.   We reviewed the MRI data demonstrating septal wall thickness of 15 mm with a degree of gadolinium enhancement increasing the possibility that this represents HOCM.   We reviewed the family hx and the importance of family screening in this fajmily of 11 children to try to increase the likelihood that this very subtle abnormality is indeed HCM  For now will not undertake any therapy  i had thought that a cath (HS-note) meant cath had been done  i am in error  Will do stress muyoview         Sherryl Manges

## 2014-11-11 NOTE — Patient Instructions (Addendum)
Your physician recommends that you continue on your current medications as directed. Please refer to the Current Medication list given to you today.  Your physician recommends that you schedule a follow-up appointment in: 3 months (family discussion - patient may/may not be at this appt)

## 2014-12-01 ENCOUNTER — Other Ambulatory Visit: Payer: Self-pay | Admitting: *Deleted

## 2014-12-01 DIAGNOSIS — Z8241 Family history of sudden cardiac death: Secondary | ICD-10-CM

## 2014-12-01 DIAGNOSIS — R9431 Abnormal electrocardiogram [ECG] [EKG]: Secondary | ICD-10-CM

## 2014-12-13 ENCOUNTER — Encounter (HOSPITAL_COMMUNITY): Payer: Self-pay

## 2015-02-18 ENCOUNTER — Ambulatory Visit: Payer: Self-pay | Admitting: Internal Medicine

## 2015-02-22 ENCOUNTER — Telehealth: Payer: Self-pay | Admitting: Internal Medicine

## 2015-02-22 ENCOUNTER — Encounter: Payer: Self-pay | Admitting: Internal Medicine

## 2016-02-28 IMAGING — MR MR CARD MORPHOLOGY WO/W CM
9 of 10 series · 39 of 40 positions shown · IV contrast (multihance)
Comparison: none

CLINICAL DATA: Family history of sudden death Hypertrophic
Cardiomyopathy

EXAM:
CARDIAC MRI
TECHNIQUE: The patient was scanned on a 1.5 Tesla GE magnet. A dedicated
cardiac coil was used. Functional imaging was done using Fiesta
sequences. [DATE], and 4 chamber views were done to assess for RWMA's.
Modified [place] rule using a short axis stack was used to
calculate an ejection fraction on a dedicated work station using
Circle software. The patient received 24 cc of Multihance. After 10
minutes inversion recovery sequences were used to assess for
infiltration and scar tissue.
CONTRAST:  24 cc Multihance

[Series 3: bSSFP · sagittal · 8.0mm · 1.37mm/px · 1 of 14 slices shown (1 of 5)]
[im 1/14]
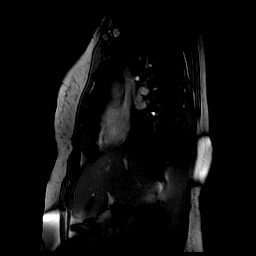

[Series 4: bSSFP · axial · 8.0mm · 1.37mm/px · 1 of 20 slices shown (2 of 5)]
[im 1/20]
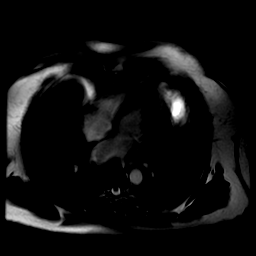

[Series 5: bSSFP · axial · 8.0mm · 1.37mm/px · 1 of 20 slices shown (3 of 5)]
[im 1/20]
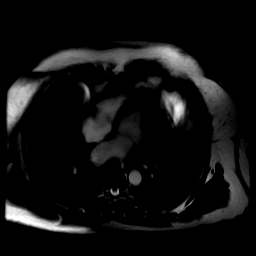

[Series 6: bSSFP · oblique · 8.0mm · 1.45mm/px · 16 of 320 slices shown (4 of 5)]
[im 1/320]
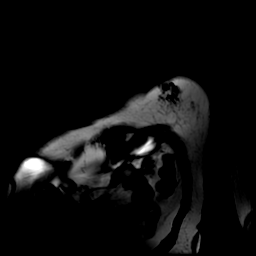
[im 22/320]
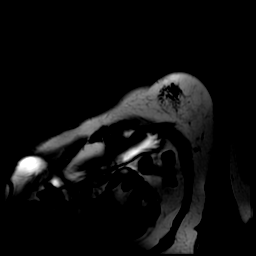
[im 43/320]
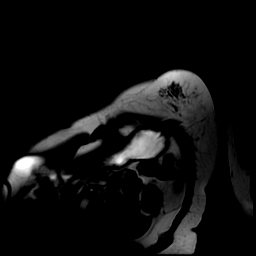
[im 64/320]
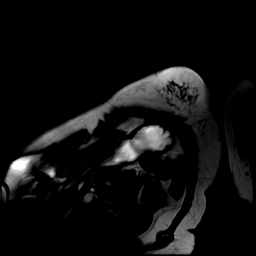
[im 86/320]
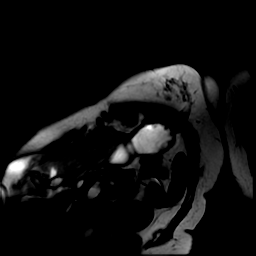
[im 107/320]
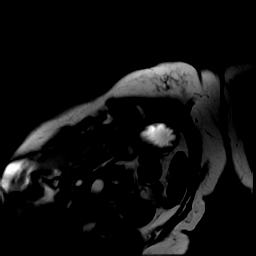
[im 128/320]
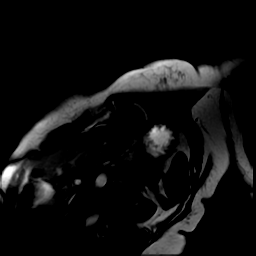
[im 149/320]
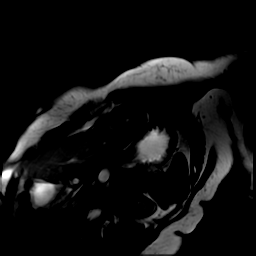
[im 171/320]
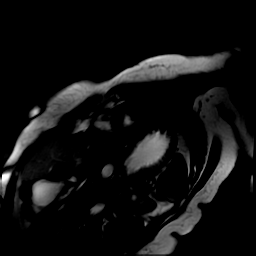
[im 192/320]
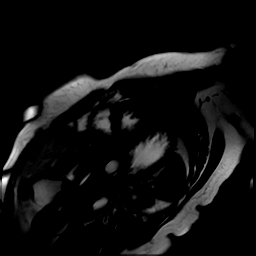
[im 213/320]
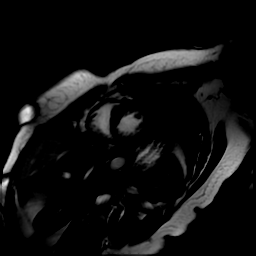
[im 234/320]
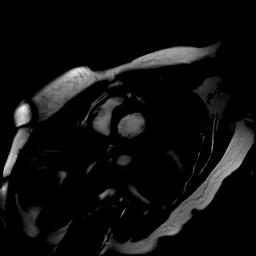
[im 256/320]
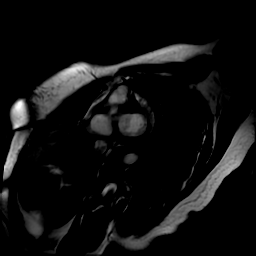
[im 277/320]
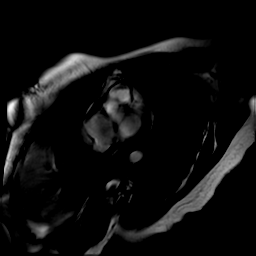
[im 298/320]
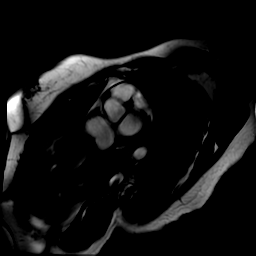
[im 320/320]
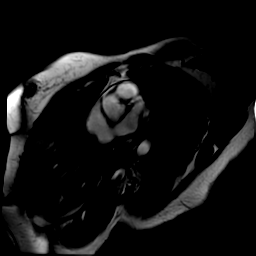

[Series 7: bSSFP · oblique · 8.0mm · 1.41mm/px · 2 of 60 slices shown (5 of 5)]
[im 1/60]
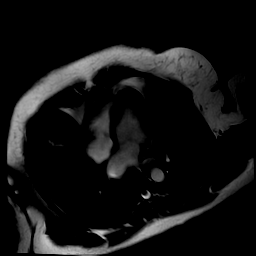
[im 60/60]
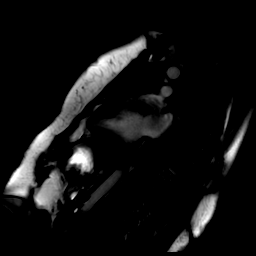

[Series 8: 4ch stack · axial · 6.0mm · 1.37mm/px · z∈[-122,-63]mm · 14 of 260 slices shown]
[im 1/260]
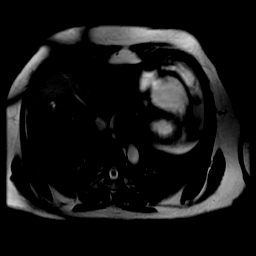
[im 20/260]
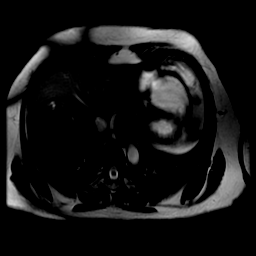
[im 40/260]
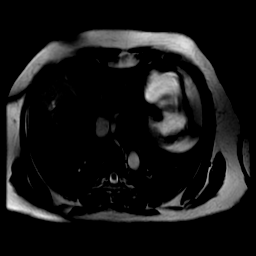
[im 60/260]
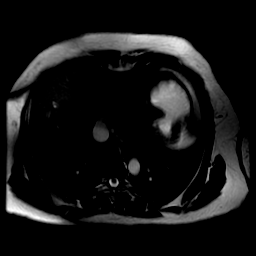
[im 80/260]
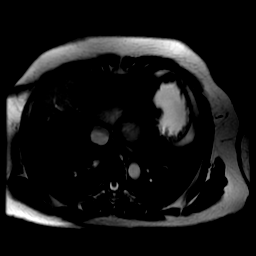
[im 100/260]
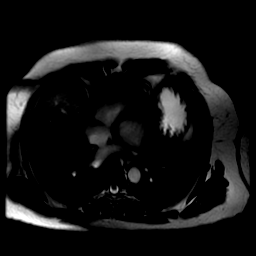
[im 120/260]
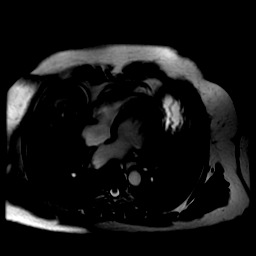
[im 140/260]
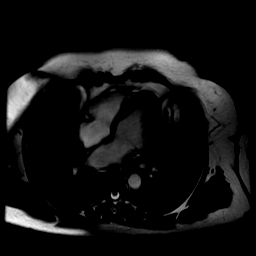
[im 160/260]
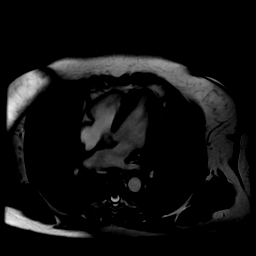
[im 180/260]
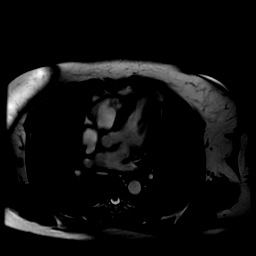
[im 200/260]
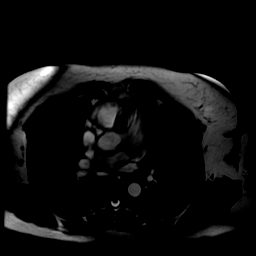
[im 220/260]
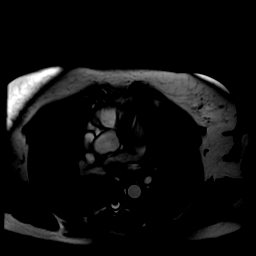
[im 240/260]
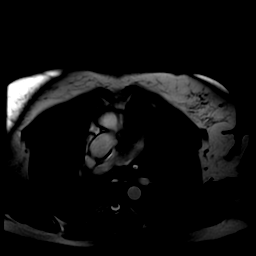
[im 260/260]
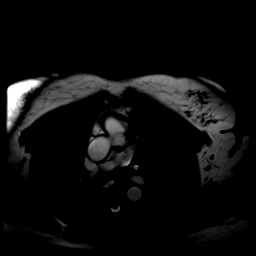

[Series 13: cine ir · axial · 10.0mm · 1.37mm/px · z∈[-60,-60]mm · 2 of 30 slices shown]
[im 1/30]
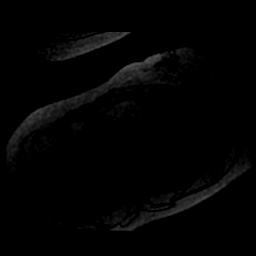
[im 30/30]
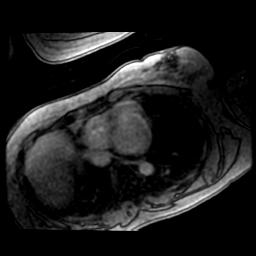

[Series 17: delayed ir prep · oblique · 8.0mm · 1.60mm/px · 1 of 14 slices shown (1 of 2)]
[im 1/14]
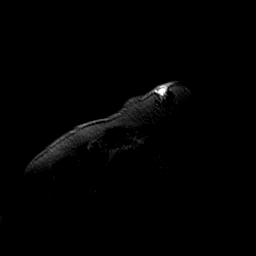

[Series 18: delayed ir prep · oblique · 8.0mm · 1.41mm/px · 1 of 3 slices shown (2 of 2)]
[im 1/3]
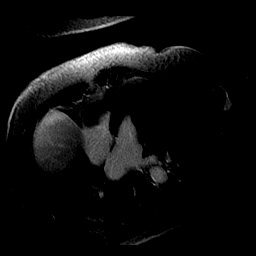

[39 of 40 positions shown; findings below may reference images not displayed]

FINDINGS: There was mild LAE. The RA/RV were normal in size and function.
Aortic, mitral and tricuspid valves structurally normal. The LV
cavity was small with apical cavity obliteration in systole. There
was a spade like cavity with small volume. There were no RWMAls
There was mild asymmetric septal hypertrophy 15mm with no LVOT
gradient. The quantitative EF was 71%. Delayed enhancement images
had mild uptake in the basal anterior septum and distal anterior
wall. There was no ASD, VSD or pericardial effusion. The ascending
aorta was normal at 3.0 cm
IMPRESSION: 1) Small LV cavity with apical obliteration in systole. No LVOT
gradient Mild asymmetric septal hypertrophy 15 mm EF 71%

2) Mild delayed enhancement in distal anterior wall mid myocardium
and basal anterior septum

3) Mild TENDERA

Jonhny Tolima
